# Patient Record
Sex: Female | Born: 1995 | Race: Black or African American | Hispanic: No | Marital: Single | Smoking: Never smoker
Health system: Southern US, Community
[De-identification: ages and names within clinical notes are randomized; demographics above are authoritative.]

## PROBLEM LIST (undated history)

## (undated) DIAGNOSIS — I671 Cerebral aneurysm, nonruptured: Secondary | ICD-10-CM

---
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
## 2019-01-31 ENCOUNTER — Other Ambulatory Visit: Payer: Self-pay

## 2019-01-31 ENCOUNTER — Encounter (HOSPITAL_COMMUNITY): Payer: Self-pay | Admitting: Emergency Medicine

## 2019-01-31 ENCOUNTER — Emergency Department (HOSPITAL_COMMUNITY)
Admission: EM | Admit: 2019-01-31 | Discharge: 2019-01-31 | Disposition: A | Discharge status: Home / Self Care | Payer: Self-pay | Attending: Emergency Medicine | Admitting: Emergency Medicine

## 2019-01-31 ENCOUNTER — Emergency Department (HOSPITAL_COMMUNITY): Payer: Self-pay

## 2019-01-31 DIAGNOSIS — G44319 Acute post-traumatic headache, not intractable: Secondary | ICD-10-CM | POA: Insufficient documentation

## 2019-01-31 DIAGNOSIS — Y939 Activity, unspecified: Secondary | ICD-10-CM | POA: Insufficient documentation

## 2019-01-31 DIAGNOSIS — Z3202 Encounter for pregnancy test, result negative: Secondary | ICD-10-CM | POA: Insufficient documentation

## 2019-01-31 DIAGNOSIS — Z8679 Personal history of other diseases of the circulatory system: Secondary | ICD-10-CM | POA: Insufficient documentation

## 2019-01-31 DIAGNOSIS — Y929 Unspecified place or not applicable: Secondary | ICD-10-CM | POA: Insufficient documentation

## 2019-01-31 DIAGNOSIS — Y999 Unspecified external cause status: Secondary | ICD-10-CM | POA: Insufficient documentation

## 2019-01-31 DIAGNOSIS — R0781 Pleurodynia: Secondary | ICD-10-CM | POA: Insufficient documentation

## 2019-01-31 HISTORY — DX: Cerebral aneurysm, nonruptured: I67.1

## 2019-01-31 LAB — POC URINE PREG, ED: Preg Test, Ur: NEGATIVE

## 2019-01-31 MED ORDER — HYDROCODONE-ACETAMINOPHEN 5-325 MG PO TABS
1.0000 | ORAL_TABLET | Freq: Once | ORAL | Status: AC
  Administered 2019-01-31: 16:00:00 1 via ORAL
  Filled 2019-01-31: qty 1

## 2019-01-31 NOTE — ED Notes (Signed)
Pain med given 

## 2019-01-31 NOTE — Discharge Instructions (Addendum)
Pregnancy test was negative. CT imaging did not show any abnormality. Take Tylenol and Ibuprofen for pain. Return for new or worsening symptoms.
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
## 2022-06-28 ENCOUNTER — Emergency Department (HOSPITAL_COMMUNITY): Payer: No Typology Code available for payment source

## 2022-06-28 ENCOUNTER — Emergency Department (HOSPITAL_COMMUNITY)
Admission: EM | Admit: 2022-06-28 | Discharge: 2022-06-28 | Disposition: A | Discharge status: Home / Self Care | Payer: No Typology Code available for payment source | Attending: Emergency Medicine | Admitting: Emergency Medicine

## 2022-06-28 ENCOUNTER — Encounter (HOSPITAL_COMMUNITY): Payer: Self-pay | Admitting: Pharmacy Technician

## 2022-06-28 ENCOUNTER — Other Ambulatory Visit: Payer: Self-pay

## 2022-06-28 DIAGNOSIS — M79602 Pain in left arm: Secondary | ICD-10-CM

## 2022-06-28 DIAGNOSIS — S139XXA Sprain of joints and ligaments of unspecified parts of neck, initial encounter: Secondary | ICD-10-CM | POA: Insufficient documentation

## 2022-06-28 DIAGNOSIS — R519 Headache, unspecified: Secondary | ICD-10-CM | POA: Insufficient documentation

## 2022-06-28 DIAGNOSIS — Y92410 Unspecified street and highway as the place of occurrence of the external cause: Secondary | ICD-10-CM | POA: Insufficient documentation

## 2022-06-28 DIAGNOSIS — M5412 Radiculopathy, cervical region: Secondary | ICD-10-CM | POA: Diagnosis not present

## 2022-06-28 DIAGNOSIS — S199XXA Unspecified injury of neck, initial encounter: Secondary | ICD-10-CM | POA: Diagnosis present

## 2022-06-28 MED ORDER — CYCLOBENZAPRINE HCL 10 MG PO TABS
5.0000 mg | ORAL_TABLET | Freq: Once | ORAL | Status: AC
  Administered 2022-06-28: 5 mg via ORAL
  Filled 2022-06-28: qty 1

## 2022-06-28 MED ORDER — CYCLOBENZAPRINE HCL 5 MG PO TABS: 5.0000 mg | ORAL_TABLET | Freq: Two times a day (BID) | ORAL | 0 refills | Status: DC | PRN

## 2022-06-28 MED ORDER — OXYCODONE-ACETAMINOPHEN 5-325 MG PO TABS
1.0000 | ORAL_TABLET | Freq: Once | ORAL | Status: AC
  Administered 2022-06-28: 1 via ORAL
  Filled 2022-06-28: qty 1

## 2022-06-28 MED ORDER — NAPROXEN 500 MG PO TABS: 500.0000 mg | ORAL_TABLET | Freq: Two times a day (BID) | ORAL | 0 refills | Status: DC | PRN

## 2022-06-28 NOTE — Discharge Instructions (Addendum)
Please follow-up with your primary care doctor, your head CT, neck CT and x-rays were unremarkable.  Is likely that you have a cervical sprain, and pinched nerve, that is causing this pain.  However it is possible that you still have a fracture, and it takes 10 days sometimes for fracture to show up.  Given how diffuse your pain is, I think it is unlikely that you have a fracture, and the pain is likely nerve related pain.  Take the muscle relaxers as needed, do not use them while driving as they may impair you.  Use naproxen for your pain control, as well as Tylenol.  Return to the ER if you have intractable nausea, vomiting, severe headache, confusion, or weakness down your arms.  If you do not have a PCP, call the number below, and they will help arrange PCP for you.

## 2022-06-28 NOTE — ED Notes (Signed)
Pt taken to CT.
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
## 2022-07-02 ENCOUNTER — Emergency Department (HOSPITAL_BASED_OUTPATIENT_CLINIC_OR_DEPARTMENT_OTHER)
Admission: EM | Admit: 2022-07-02 | Discharge: 2022-07-02 | Disposition: A | Discharge status: Home / Self Care | Payer: No Typology Code available for payment source | Attending: Emergency Medicine | Admitting: Emergency Medicine

## 2022-07-02 ENCOUNTER — Other Ambulatory Visit: Payer: Self-pay

## 2022-07-02 ENCOUNTER — Encounter (HOSPITAL_BASED_OUTPATIENT_CLINIC_OR_DEPARTMENT_OTHER): Payer: Self-pay | Admitting: Emergency Medicine

## 2022-07-02 DIAGNOSIS — Y92410 Unspecified street and highway as the place of occurrence of the external cause: Secondary | ICD-10-CM | POA: Diagnosis not present

## 2022-07-02 DIAGNOSIS — M25512 Pain in left shoulder: Secondary | ICD-10-CM | POA: Diagnosis present

## 2022-07-02 DIAGNOSIS — M62838 Other muscle spasm: Secondary | ICD-10-CM | POA: Insufficient documentation

## 2022-07-02 MED ORDER — METHOCARBAMOL 500 MG PO TABS: 500.0000 mg | ORAL_TABLET | Freq: Two times a day (BID) | ORAL | 0 refills | Status: AC

## 2022-07-02 MED ORDER — PREDNISONE 10 MG (21) PO TBPK: ORAL_TABLET | Freq: Every day | ORAL | 0 refills | Status: AC

## 2022-07-02 MED ORDER — LIDOCAINE 5 % EX PTCH
1.0000 | MEDICATED_PATCH | CUTANEOUS | Status: DC
  Administered 2022-07-02: 1 via TRANSDERMAL
  Filled 2022-07-02: qty 1

## 2022-07-02 MED ORDER — PREDNISONE 50 MG PO TABS
60.0000 mg | ORAL_TABLET | Freq: Once | ORAL | Status: AC
  Administered 2022-07-02: 60 mg via ORAL
  Filled 2022-07-02: qty 1

## 2022-07-02 MED ORDER — DICLOFENAC POTASSIUM 50 MG PO TABS: 50.0000 mg | ORAL_TABLET | Freq: Two times a day (BID) | ORAL | 0 refills | Status: AC

## 2022-07-02 MED ORDER — OMEPRAZOLE 20 MG PO CPDR: 20.0000 mg | DELAYED_RELEASE_CAPSULE | Freq: Every day | ORAL | 0 refills | Status: AC

## 2022-07-02 NOTE — Discharge Instructions (Addendum)
Discontinue the naproxen and Flexeril. Recommend warm compress for twenty minutes at a time followed with gentle stretching and massage of area.  Take diclofenac (with food) and Robaxin as needed as prescribed.  Do not drive if taking Robaxin. Take prednisone daily starting tomorrow. Take Omeprazole daily, this is to help protect your stomach while taking the above medications. Stop the Diclofenac if you develop stomach pain.
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
## 2022-07-06 ENCOUNTER — Encounter (HOSPITAL_BASED_OUTPATIENT_CLINIC_OR_DEPARTMENT_OTHER): Payer: Self-pay | Admitting: Emergency Medicine

## 2022-07-06 ENCOUNTER — Emergency Department (HOSPITAL_BASED_OUTPATIENT_CLINIC_OR_DEPARTMENT_OTHER): Payer: No Typology Code available for payment source | Admitting: Radiology

## 2022-07-06 ENCOUNTER — Other Ambulatory Visit: Payer: Self-pay

## 2022-07-06 ENCOUNTER — Emergency Department (HOSPITAL_BASED_OUTPATIENT_CLINIC_OR_DEPARTMENT_OTHER)
Admission: EM | Admit: 2022-07-06 | Discharge: 2022-07-06 | Disposition: A | Discharge status: Home / Self Care | Payer: No Typology Code available for payment source | Attending: Emergency Medicine | Admitting: Emergency Medicine

## 2022-07-06 DIAGNOSIS — R0789 Other chest pain: Secondary | ICD-10-CM | POA: Diagnosis present

## 2022-07-06 DIAGNOSIS — M25512 Pain in left shoulder: Secondary | ICD-10-CM | POA: Insufficient documentation

## 2022-07-06 DIAGNOSIS — R0602 Shortness of breath: Secondary | ICD-10-CM | POA: Insufficient documentation

## 2022-07-06 LAB — CBC
HCT: 40 % (ref 36.0–46.0)
Hemoglobin: 13.2 g/dL (ref 12.0–15.0)
MCH: 28.8 pg (ref 26.0–34.0)
MCHC: 33 g/dL (ref 30.0–36.0)
MCV: 87.1 fL (ref 80.0–100.0)
Platelets: 304 10*3/uL (ref 150–400)
RBC: 4.59 MIL/uL (ref 3.87–5.11)
RDW: 14.2 % (ref 11.5–15.5)
WBC: 5.3 10*3/uL (ref 4.0–10.5)
nRBC: 0 % (ref 0.0–0.2)

## 2022-07-06 LAB — BASIC METABOLIC PANEL
Anion gap: 7 (ref 5–15)
BUN: 14 mg/dL (ref 6–20)
CO2: 24 mmol/L (ref 22–32)
Calcium: 9.1 mg/dL (ref 8.9–10.3)
Chloride: 105 mmol/L (ref 98–111)
Creatinine, Ser: 0.72 mg/dL (ref 0.44–1.00)
GFR, Estimated: >60 mL/min (ref >60)
Glucose, Bld: 77 mg/dL (ref 70–99)
Potassium: 4.7 mmol/L (ref 3.5–5.1)
Sodium: 136 mmol/L (ref 135–145)

## 2022-07-06 LAB — HCG, SERUM, QUALITATIVE: Preg, Serum: NEGATIVE

## 2022-07-06 LAB — TROPONIN I (HIGH SENSITIVITY): Troponin I (High Sensitivity): <2 ng/L (ref <18)

## 2022-07-06 MED ORDER — KETOROLAC TROMETHAMINE 15 MG/ML IJ SOLN
15.0000 mg | Freq: Once | INTRAMUSCULAR | Status: AC
  Administered 2022-07-06: 15 mg via INTRAVENOUS
  Filled 2022-07-06: qty 1
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
